# Patient Record
Sex: Male | Born: 1980 | Race: Black or African American | Hispanic: No | Marital: Married | Smoking: Never smoker
Health system: Southern US, Community
[De-identification: ages and names within clinical notes are randomized; demographics above are authoritative.]

## PROBLEM LIST (undated history)

## (undated) HISTORY — PX: SHOULDER SURGERY: SHX246

## (undated) HISTORY — PX: KNEE SURGERY: SHX244

---

## 2004-08-20 ENCOUNTER — Encounter: Admission: RE | Admit: 2004-08-20 | Discharge: 2004-08-20 | Payer: Self-pay | Admitting: Sports Medicine

## 2004-10-11 ENCOUNTER — Ambulatory Visit (HOSPITAL_BASED_OUTPATIENT_CLINIC_OR_DEPARTMENT_OTHER): Admission: RE | Admit: 2004-10-11 | Discharge: 2004-10-11 | Payer: Self-pay | Admitting: Orthopedic Surgery

## 2005-09-20 ENCOUNTER — Emergency Department: Payer: Self-pay | Admitting: Emergency Medicine

## 2005-09-21 ENCOUNTER — Emergency Department: Payer: Self-pay | Admitting: Emergency Medicine

## 2005-09-23 ENCOUNTER — Inpatient Hospital Stay: Payer: Self-pay | Admitting: Internal Medicine

## 2006-01-13 ENCOUNTER — Emergency Department: Payer: Self-pay | Admitting: Emergency Medicine

## 2006-01-21 ENCOUNTER — Inpatient Hospital Stay: Payer: Self-pay | Admitting: Internal Medicine

## 2006-07-06 ENCOUNTER — Emergency Department: Payer: Self-pay | Admitting: Internal Medicine

## 2006-09-04 ENCOUNTER — Emergency Department: Payer: Self-pay | Admitting: General Practice

## 2007-08-03 ENCOUNTER — Emergency Department: Payer: Self-pay | Admitting: Emergency Medicine

## 2008-04-26 ENCOUNTER — Emergency Department: Payer: Self-pay | Admitting: Emergency Medicine

## 2008-10-14 ENCOUNTER — Emergency Department: Payer: Self-pay | Admitting: Internal Medicine

## 2009-08-20 ENCOUNTER — Emergency Department: Payer: Self-pay | Admitting: Emergency Medicine

## 2009-08-30 ENCOUNTER — Ambulatory Visit: Payer: Self-pay | Admitting: Orthopedic Surgery

## 2009-09-01 ENCOUNTER — Inpatient Hospital Stay: Payer: Self-pay | Admitting: Vascular Surgery

## 2009-09-04 ENCOUNTER — Ambulatory Visit: Payer: Self-pay | Admitting: Orthopedic Surgery

## 2009-10-05 ENCOUNTER — Encounter: Payer: Self-pay | Admitting: Orthopedic Surgery

## 2009-11-04 ENCOUNTER — Encounter: Payer: Self-pay | Admitting: Orthopedic Surgery

## 2009-11-09 ENCOUNTER — Ambulatory Visit: Payer: Self-pay | Admitting: Orthopedic Surgery

## 2009-12-05 ENCOUNTER — Encounter: Payer: Self-pay | Admitting: Orthopedic Surgery

## 2010-01-02 ENCOUNTER — Encounter: Payer: Self-pay | Admitting: Orthopedic Surgery

## 2011-02-22 IMAGING — CR LEFT WRIST - COMPLETE 3+ VIEW
1 series · 4 of 4 positions shown · non-contrast
Comparison: None

REASON FOR EXAM: pain, injry
COMMENTS:

PROCEDURE:     DXR - DXR WRIST LT COMP WITH OBLIQUES  - August 20, 2009  [DATE]
RESULT:     History: Pain

[Series 1: view not recorded · 0.17mm/px · 4 of 4 slices shown]
[im 1/4]
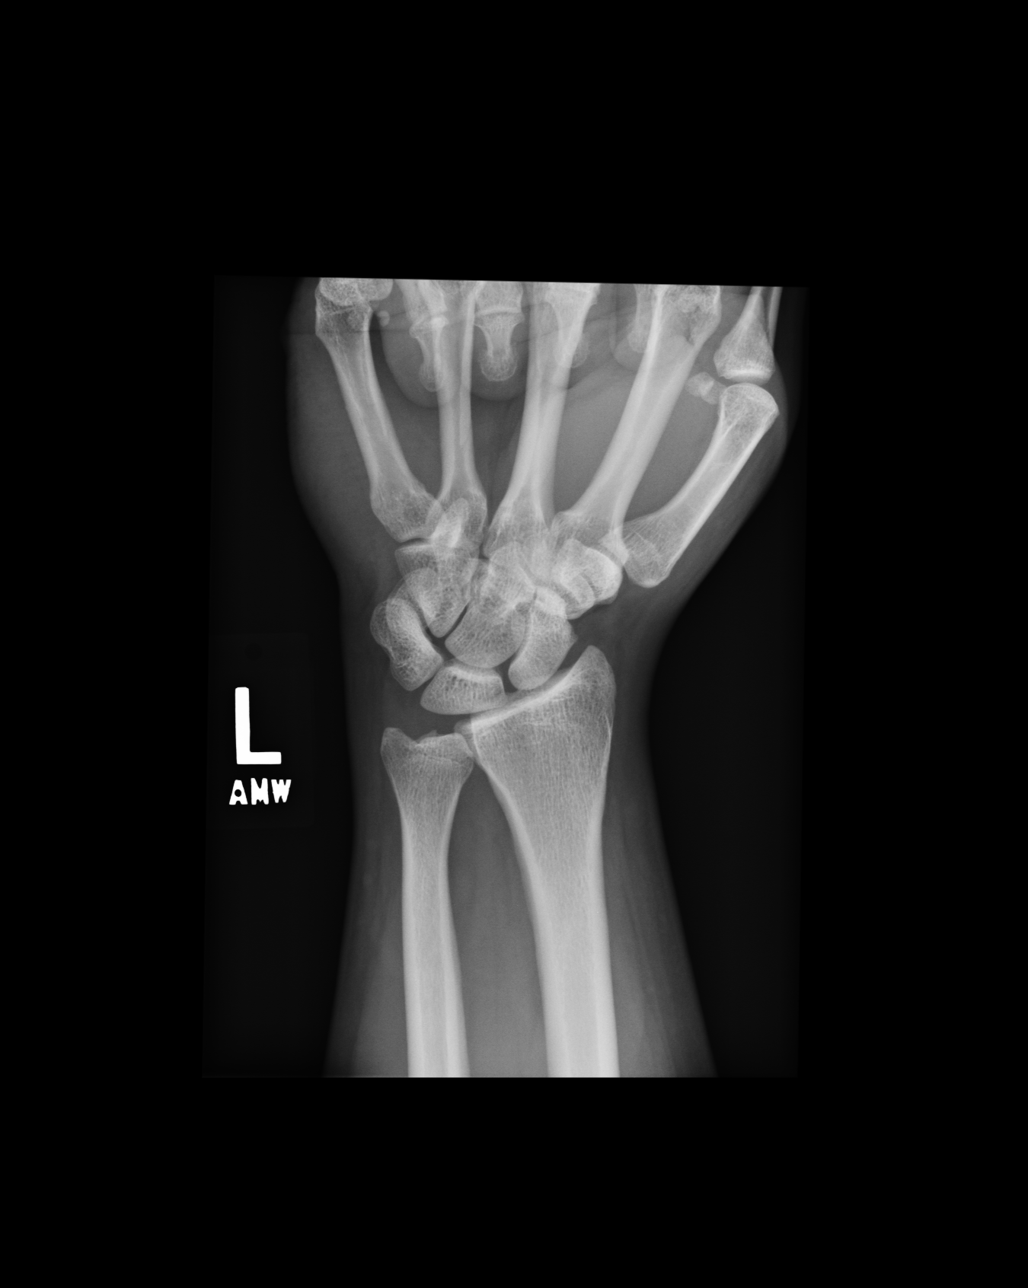
[im 2/4]
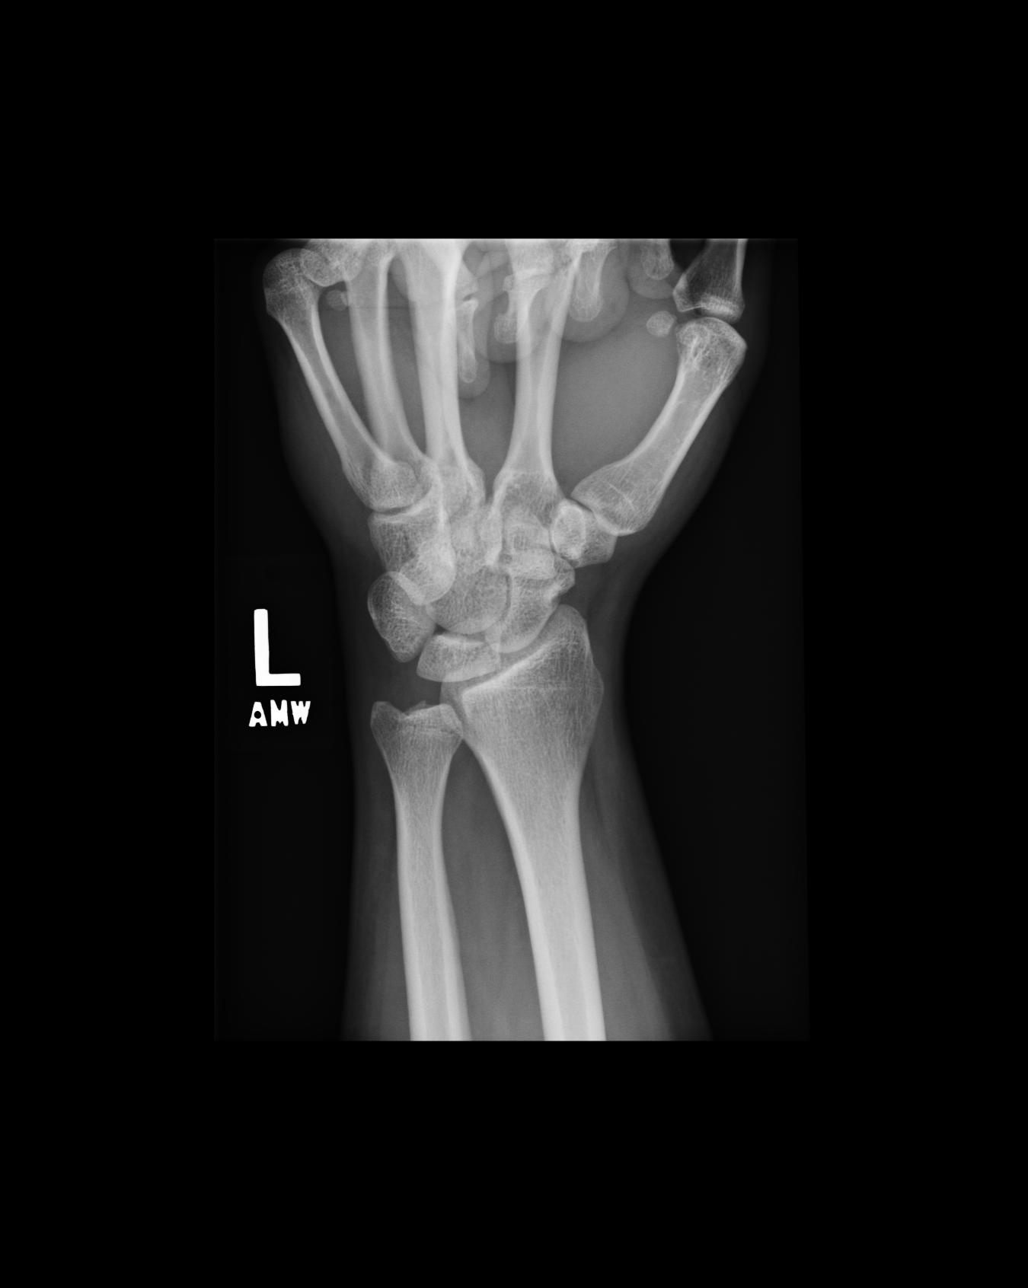
[im 3/4]
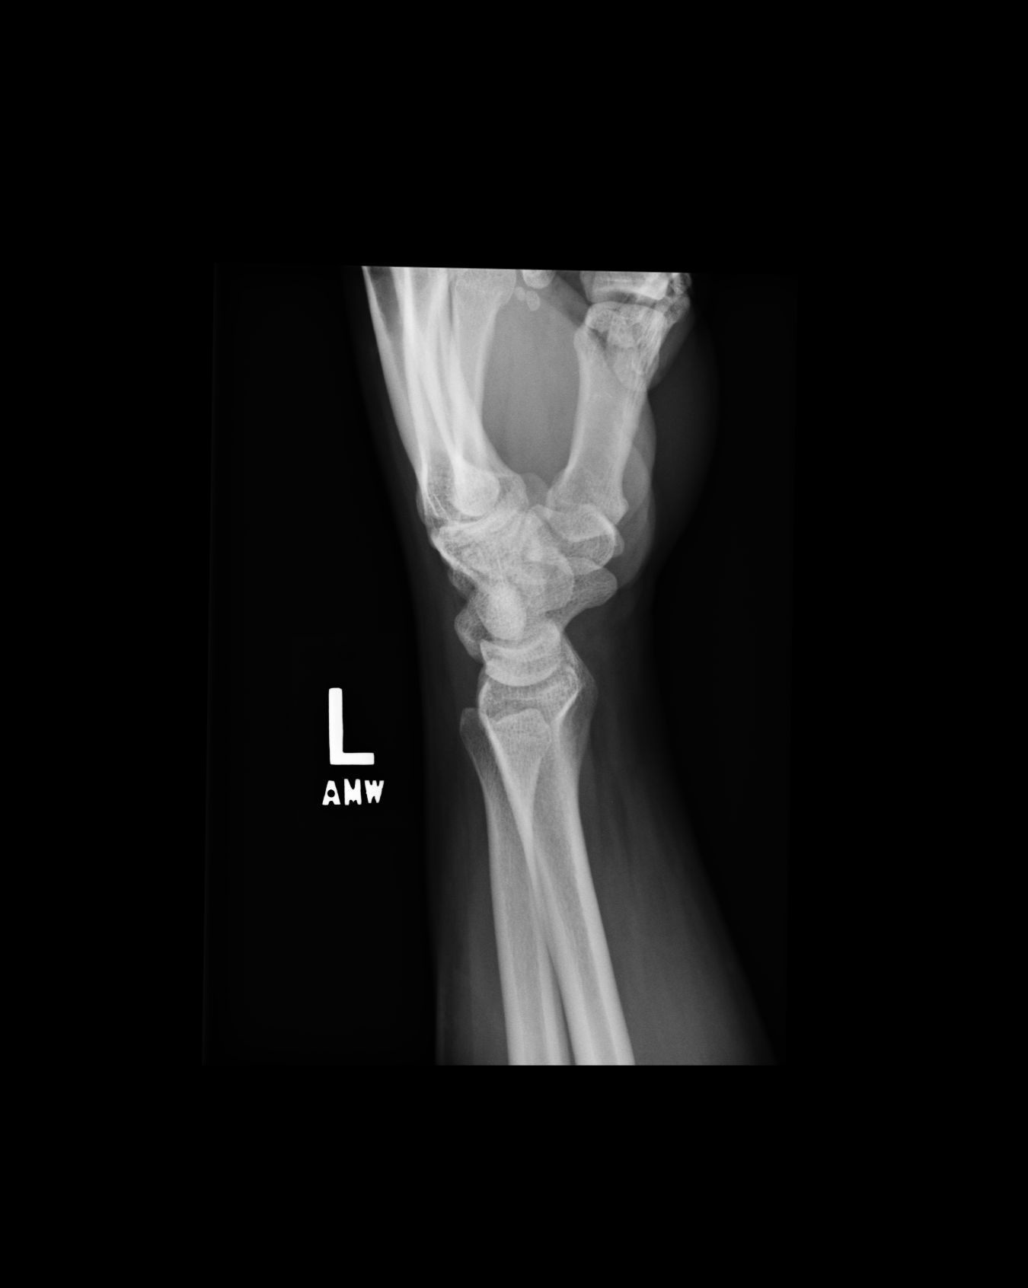
[im 4/4]
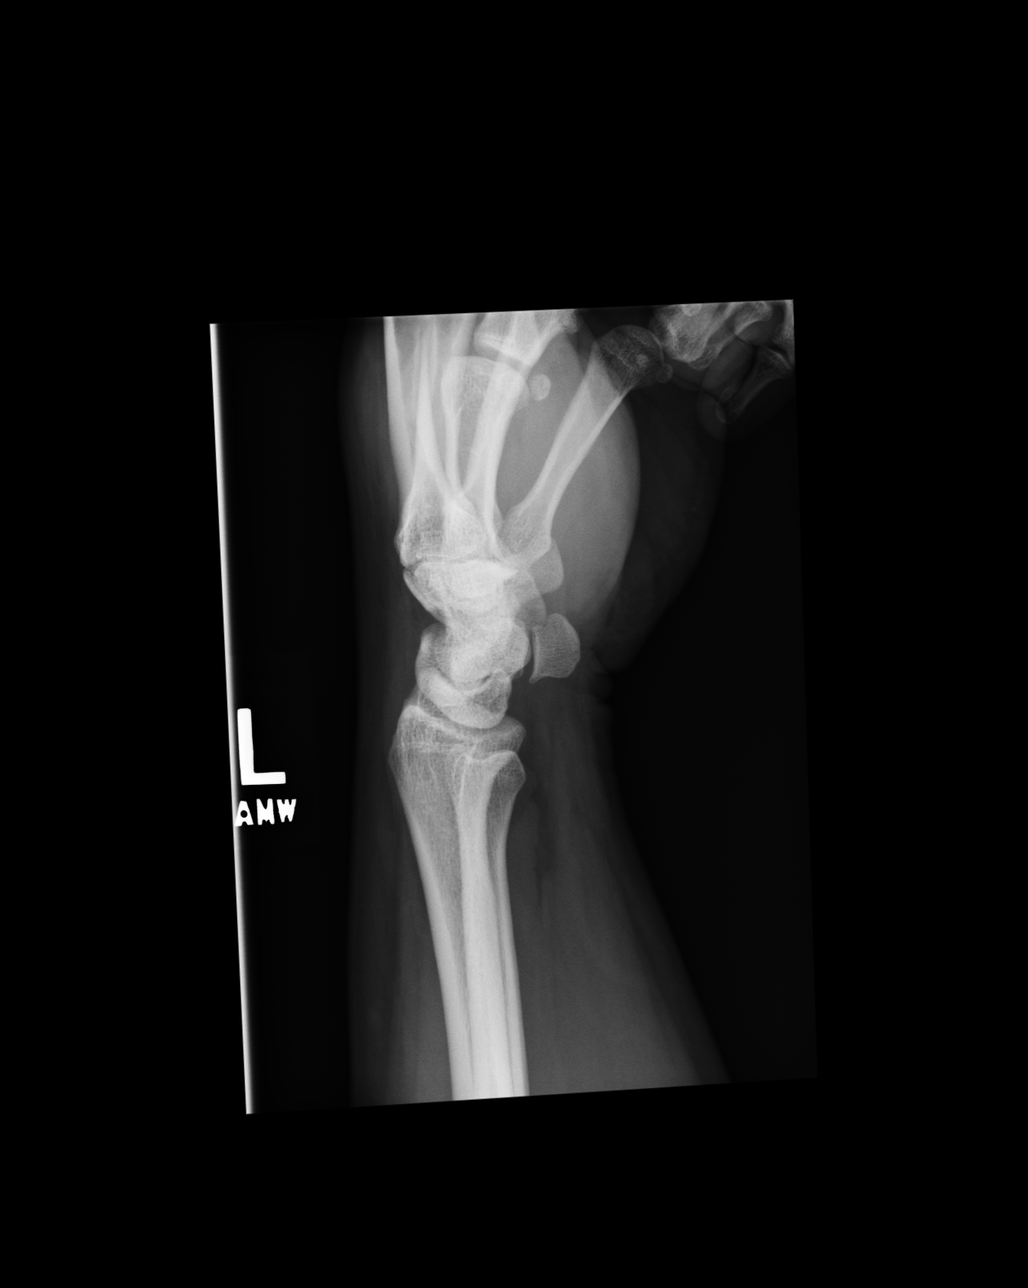

[4 of 4 positions shown; findings below may reference images not displayed]

FINDINGS: 4 views of the right wrist demonstrates no acute fracture or dislocation.
The joint spaces are maintained. The soft tissues appear normal.
IMPRESSION: No acute osseous abnormality of the right wrist.

If there is clinical concern regarding a radiographically occult scaphoid
fracture, snuff box tenderness, or ligamentous injury, further assessment
with MRI is recommended.

## 2011-02-22 IMAGING — CR DG KNEE COMPLETE 4+V*R*
1 series · 4 of 4 positions shown · non-contrast
Comparison: none

REASON FOR EXAM: PAINFUL
COMMENTS:

PROCEDURE:     DXR - DXR KNEE RT COMP WITH OBLIQUES  - August 20, 2009 [DATE]
RESULT:     Comparison:  None

[Series 1: view not recorded · 0.17mm/px · 4 of 4 slices shown]
[im 1/4]
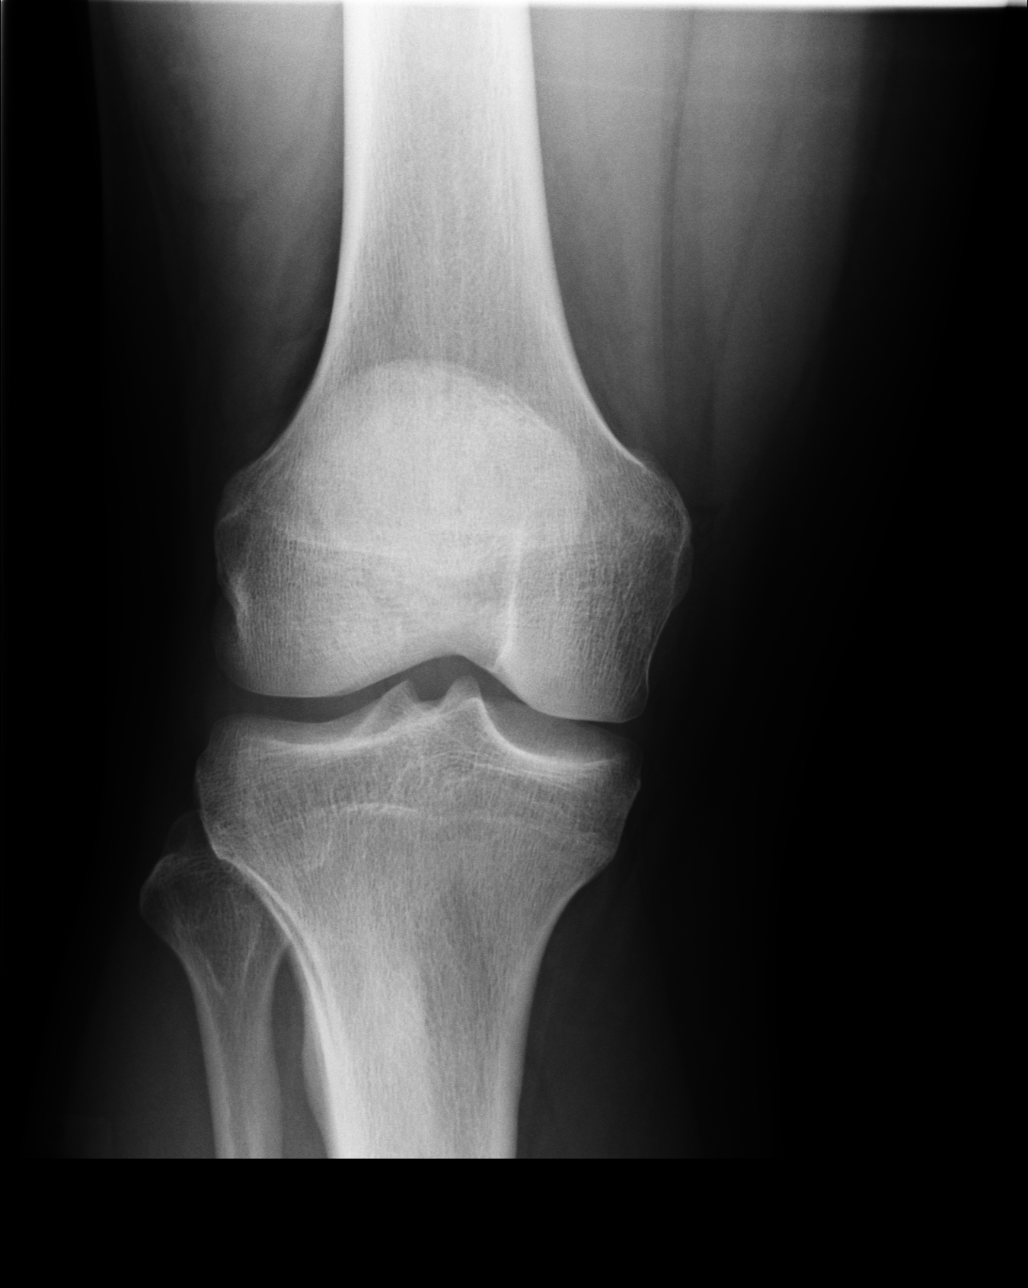
[im 2/4]
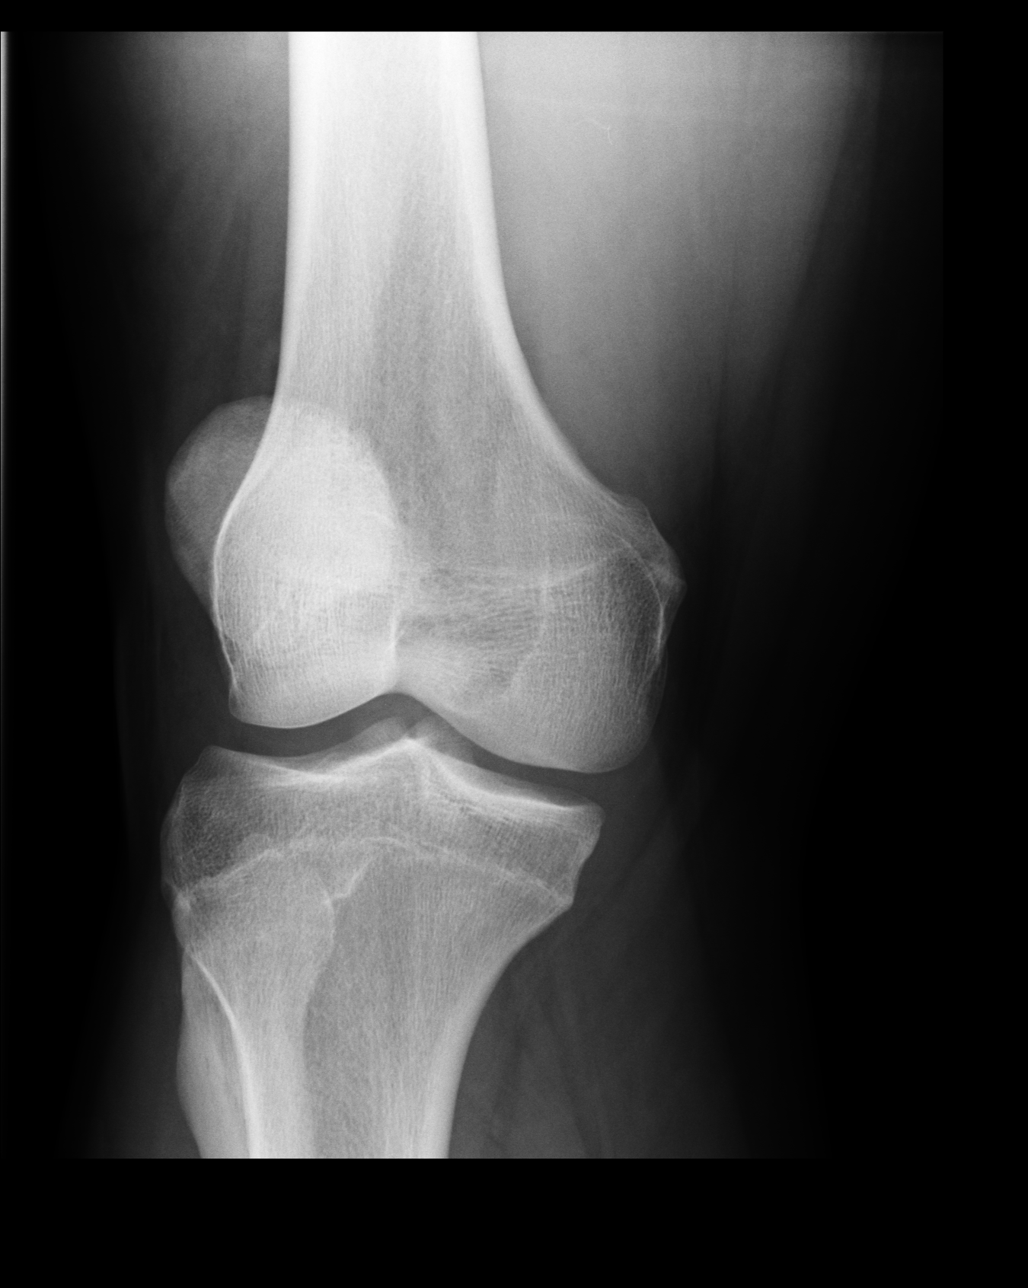
[im 3/4]
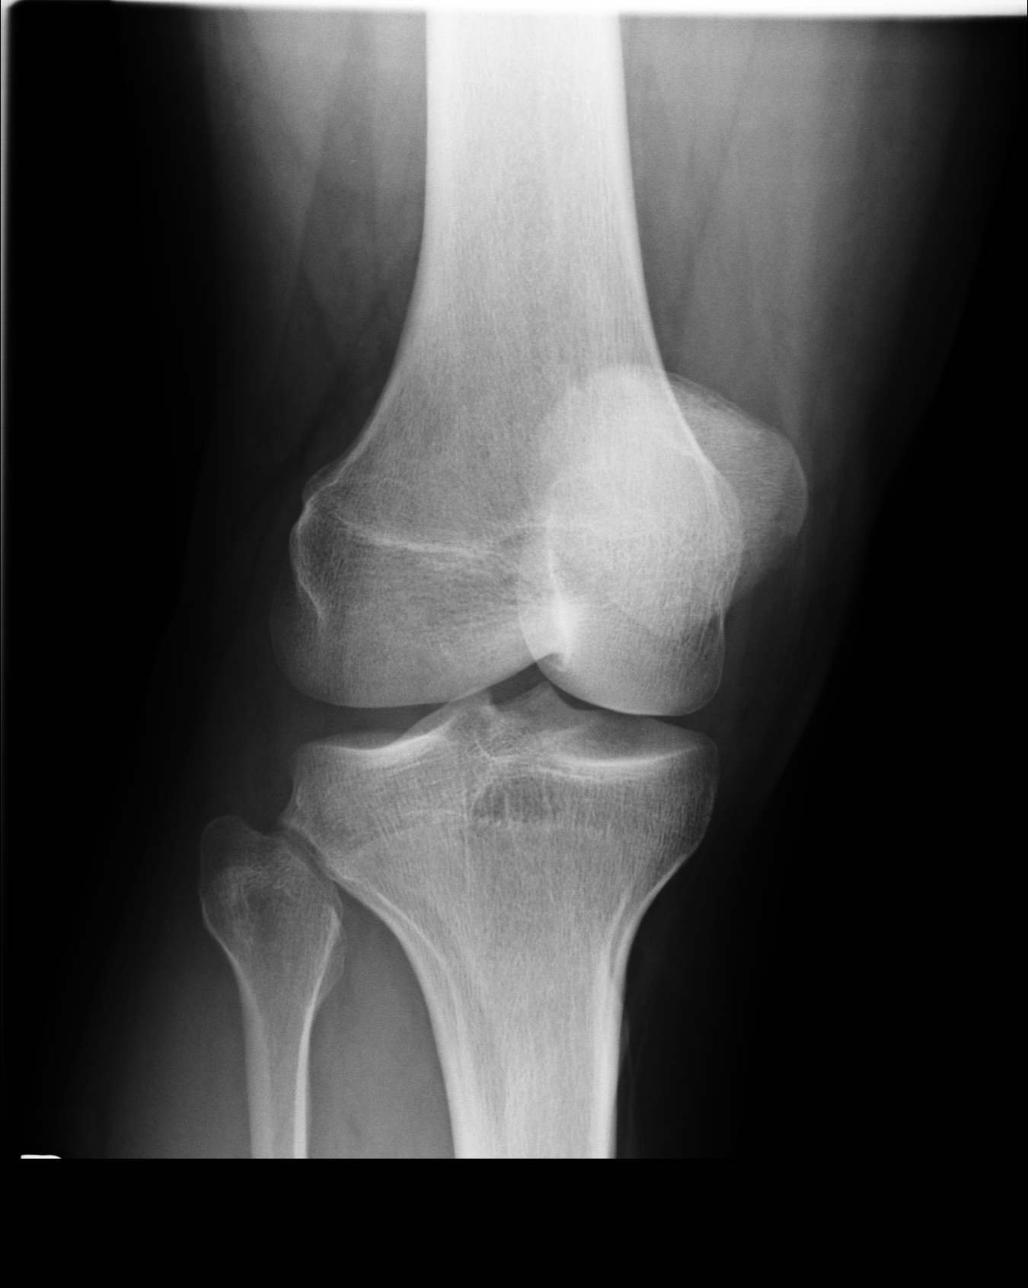
[im 4/4]
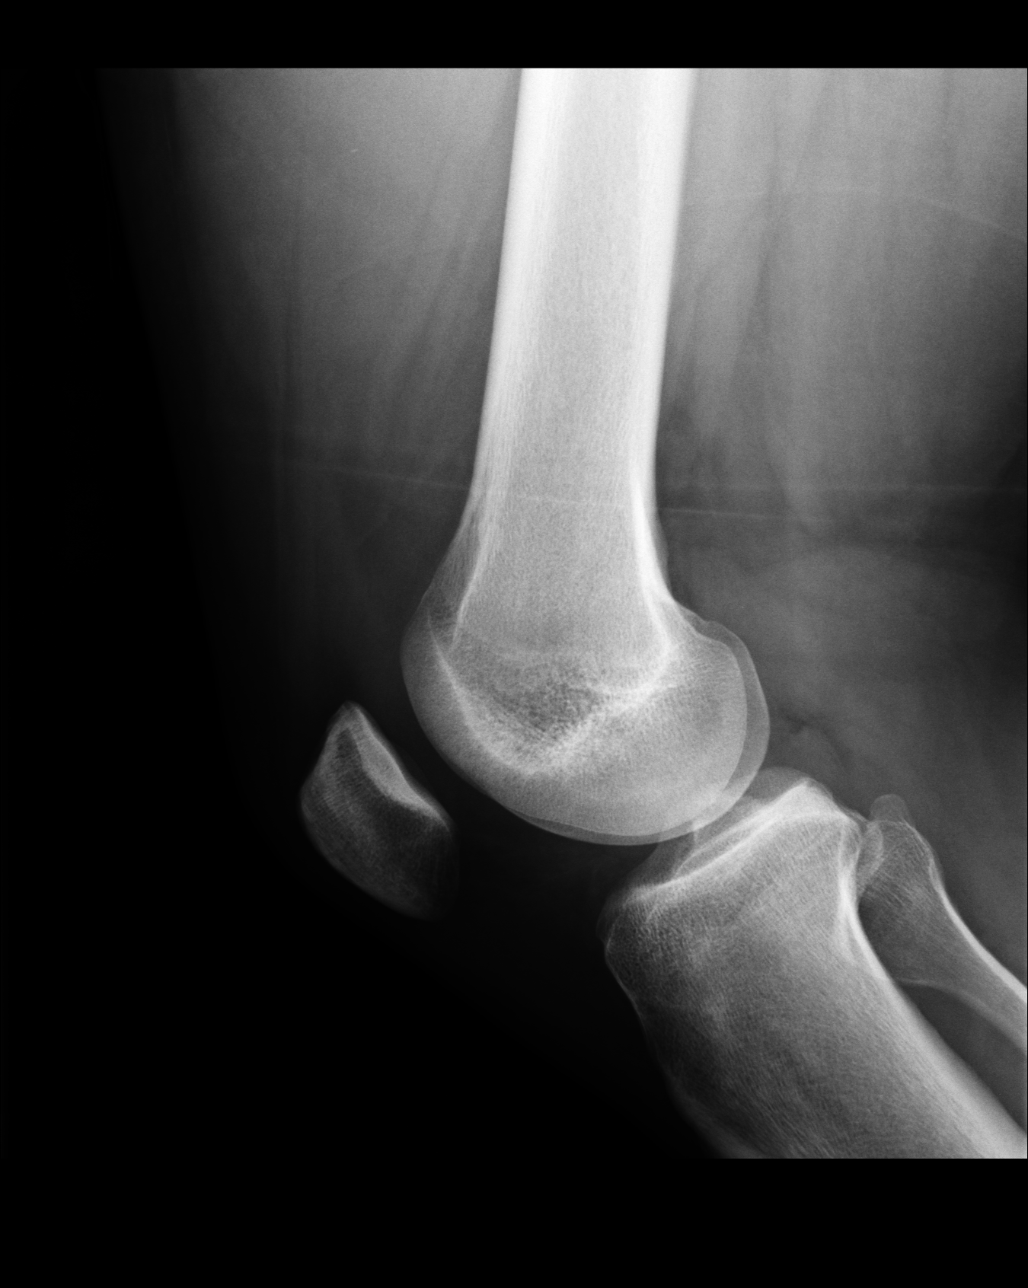

[4 of 4 positions shown; findings below may reference images not displayed]

FINDINGS: 4 views of the right knee demonstrates no acute fracture or dislocation.
There is no significant joint effusion.
IMPRESSION: No acute osseous injury of the right knee.

## 2015-10-10 ENCOUNTER — Encounter: Payer: Self-pay | Admitting: Emergency Medicine

## 2015-10-10 ENCOUNTER — Emergency Department: Payer: BC Managed Care – PPO

## 2015-10-10 ENCOUNTER — Emergency Department
Admission: EM | Admit: 2015-10-10 | Discharge: 2015-10-10 | Disposition: A | Discharge status: Home / Self Care | Payer: BC Managed Care – PPO | Attending: Emergency Medicine | Admitting: Emergency Medicine

## 2015-10-10 DIAGNOSIS — M25512 Pain in left shoulder: Secondary | ICD-10-CM | POA: Diagnosis present

## 2015-10-10 DIAGNOSIS — M19019 Primary osteoarthritis, unspecified shoulder: Secondary | ICD-10-CM

## 2015-10-10 DIAGNOSIS — M19012 Primary osteoarthritis, left shoulder: Secondary | ICD-10-CM | POA: Insufficient documentation

## 2015-10-10 MED ORDER — KETOROLAC TROMETHAMINE 60 MG/2ML IM SOLN
60.0000 mg | Freq: Once | INTRAMUSCULAR | Status: AC
  Administered 2015-10-10: 60 mg via INTRAMUSCULAR
  Filled 2015-10-10: qty 2

## 2015-10-10 MED ORDER — MELOXICAM 15 MG PO TABS: 15.0000 mg | ORAL_TABLET | Freq: Every day | ORAL | Status: DC

## 2015-10-10 NOTE — ED Notes (Signed)
Pt c/o left shoulder pain since he woke Sunday morning; worked out yesterday and pain is worse today; says pain at rest, worse with movement; pt took hydrocodone at 0630 and ibuprofen at 9 with some relief;

## 2015-10-10 NOTE — ED Notes (Signed)
Pt in w/ complaints of left shoulder pain since Sunday; reports waking up w/ this new pain.  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

## 2015-10-10 NOTE — ED Provider Notes (Signed)
[facility] Emergency Department Provider Note  ____________________________________________  Time seen: Approximately 11:08 AM  I have reviewed the triage vital signs and the nursing notes.   HISTORY  Chief Complaint Shoulder Pain   HPI Billy Weaver is a 34 y.o. male is here complaining of left shoulder pain 3 days. Patient states that this morning he woke up and the pain was worse. He took a hydrocodone at 6:30 AM today along with a ibuprofen at 9 AM with some relief. He denies any recent injury. 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 He denies any chest pain or shortness of breath. There is been no nausea vomiting. He denies any paresthesias in his left arm.Currently his pain is 5 out of 10.   History reviewed. No pertinent past medical history.  There are no active problems to display for this patient.   Past Surgical History  Procedure Laterality Date  . Shoulder surgery Right   . Knee surgery Right     Current Outpatient Rx  Name  Route  Sig  Dispense  Refill  . meloxicam (MOBIC) 15 MG tablet   Oral   Take 1 tablet (15 mg total) by mouth daily.   30 tablet   2     Allergies Review of patient's allergies indicates no known allergies.  History reviewed. No pertinent family history.  Social History Social History  Substance Use Topics  . Smoking status: Never Smoker   . Smokeless tobacco: None  . Alcohol Use: Yes    Review of Systems Constitutional: No fever/chills ENT: No sore throat. Cardiovascular: Denies chest pain. Respiratory: Denies shortness of breath. Gastrointestinal:   No nausea, no vomiting.  Musculoskeletal: Negative for back pain. Positive left shoulder pain. Skin: Negative for rash. Neurological: Negative for headaches, focal weakness or numbness.  10-point ROS otherwise negative.  ____________________________________________   PHYSICAL  EXAM:  VITAL SIGNS: ED Triage Vitals  Enc Vitals Group     BP 10/10/15 1051 119/82 mmHg     Pulse Rate 10/10/15 1051 66     Resp 10/10/15 1051 20     Temp 10/10/15 1051 98.2 F (36.8 C)     Temp Source 10/10/15 1051 Oral     SpO2 10/10/15 1051 99 %     Weight 10/10/15 1051 265 lb (120.203 kg)     Height 10/10/15 1051 6' 1.5" (1.867 m)     Head Cir --      Peak Flow --      Pain Score 10/10/15 1053 5     Pain Loc --      Pain Edu? --      Excl. in GC? --     Constitutional: Alert and oriented. Well appearing and in no acute distress. Eyes: Conjunctivae are normal. PERRL. EOMI. Head: Atraumatic. Nose: No congestion/rhinnorhea. Neck: No stridor.   Cardiovascular: Normal rate, regular rhythm. Grossly normal heart sounds.  Good peripheral circulation. Respiratory: Normal respiratory effort.  No retractions. Lungs CTAB. Gastrointestinal: Soft and nontender. No distention. Musculoskeletal: Left shoulder no gross deformity was seen. No joint effusion. There is moderate tenderness on palpation of the [facility] joint, anterior aspect and deltoid area. Range of motion is within normal limits in all planes with no crepitus noted. Motor sensory function intact. Muscle strength within normal limits. Neurologic:  Normal speech and language. No gross focal neurologic deficits are appreciated. No gait instability. Skin:  Skin is warm, dry and intact.  No rash noted. Psychiatric: Mood and affect are normal. Speech and behavior are normal.  ____________________________________________   LABS (all labs ordered are listed, but only abnormal results are displayed)  Labs Reviewed - No data to display  RADIOLOGY  Left shoulder x-ray per radiologist shows rotator cuff calcific tendinitis along with diffuse spurring from the acromioclavicular joint. I, Tommi Rumpshonda L Brittany Amirault, personally viewed and evaluated these images (plain radiographs) as part of my medical decision making.   ____________________________________________   PROCEDURES  Procedure(s) performed: None  Critical Care performed: No  ____________________________________________   INITIAL IMPRESSION / ASSESSMENT AND PLAN / ED COURSE  Pertinent labs & imaging results that were available during my care of the patient were reviewed by me and considered in my medical decision making (see chart for details).  Patient was given Toradol 60 mg IM while in the emergency room and a prescription for meloxicam 15 milligrams to be taken once a day with food. He is also to follow-up with Dr. Joice LoftsPoggi if any continued problems with his shoulder. ____________________________________________   FINAL CLINICAL IMPRESSION(S) / ED DIAGNOSES  Final diagnoses:  Shoulder pain, acute, left  Degenerative joint disease of acromioclavicular joint      Tommi Rumpshonda L Phebe Dettmer, PA-C 10/10/15 1247  Emily FilbertJonathan E Williams, MD 10/10/15 [phone]

## 2015-10-10 NOTE — Discharge Instructions (Signed)
Cryotherapy  Cryotherapy is when you put ice on your injury. Ice helps lessen pain and puffiness (swelling) after an injury. Ice works the best when you start using it in the first 24 to 48 hours after an injury.  HOME CARE  · Put a dry or damp towel between the ice pack and your skin.  · You may press gently on the ice pack.  · Leave the ice on for no more than 10 to 20 minutes at a time.  · Check your skin after 5 minutes to make sure your skin is okay.  · Rest at least 20 minutes between ice pack uses.  · Stop using ice when your skin loses feeling (numbness).  · Do not use ice on someone who cannot tell you when it hurts. This includes small children and people with memory problems (dementia).  GET HELP RIGHT AWAY IF:  · You have white spots on your skin.  · Your skin turns blue or pale.  · Your skin feels waxy or hard.  · Your puffiness gets worse.  MAKE SURE YOU:   · Understand these instructions.  · Will watch your condition.  · Will get help right away if you are not doing well or get worse.     This information is not intended to replace advice given to you by your health care provider. Make sure you discuss any questions you have with your health care provider.     Document Released: 04/08/2008 Document Revised: 01/13/2012 Document Reviewed: 06/13/2011  Elsevier Interactive Patient Education ©2016 [employer]

## 2017-07-08 ENCOUNTER — Emergency Department: Payer: BC Managed Care – PPO

## 2017-07-08 ENCOUNTER — Emergency Department
Admission: EM | Admit: 2017-07-08 | Discharge: 2017-07-08 | Disposition: A | Discharge status: Home / Self Care | Payer: BC Managed Care – PPO | Attending: Emergency Medicine | Admitting: Emergency Medicine

## 2017-07-08 DIAGNOSIS — R079 Chest pain, unspecified: Secondary | ICD-10-CM | POA: Diagnosis not present

## 2017-07-08 LAB — CBC WITH DIFFERENTIAL/PLATELET
Basophils Absolute: 0 10*3/uL (ref 0–0.1)
Basophils Relative: 1 %
Eosinophils Absolute: 0.3 10*3/uL (ref 0–0.7)
Eosinophils Relative: 5 %
HCT: 41.2 % (ref 40.0–52.0)
Hemoglobin: 14.3 g/dL (ref 13.0–18.0)
Lymphocytes Relative: 37 %
Lymphs Abs: 2.5 10*3/uL (ref 1.0–3.6)
MCH: 30.6 pg (ref 26.0–34.0)
MCHC: 34.7 g/dL (ref 32.0–36.0)
MCV: 88.4 fL (ref 80.0–100.0)
Monocytes Absolute: 0.6 10*3/uL (ref 0.2–1.0)
Monocytes Relative: 10 %
Neutro Abs: 3.2 10*3/uL (ref 1.4–6.5)
Neutrophils Relative %: 47 %
Platelets: 314 10*3/uL (ref 150–440)
RBC: 4.66 MIL/uL (ref 4.40–5.90)
RDW: 13 % (ref 11.5–14.5)
WBC: 6.7 10*3/uL (ref 3.8–10.6)

## 2017-07-08 LAB — BASIC METABOLIC PANEL
Anion gap: 8 (ref 5–15)
BUN: 9 mg/dL (ref 6–20)
CO2: 27 mmol/L (ref 22–32)
Calcium: 9.2 mg/dL (ref 8.9–10.3)
Chloride: 103 mmol/L (ref 101–111)
Creatinine, Ser: 1.3 mg/dL — ABNORMAL HIGH (ref 0.61–1.24)
GFR calc Af Amer: >60 mL/min (ref >60)
GFR calc non Af Amer: >60 mL/min (ref >60)
Glucose, Bld: 101 mg/dL — ABNORMAL HIGH (ref 65–99)
Potassium: 3.9 mmol/L (ref 3.5–5.1)
Sodium: 138 mmol/L (ref 135–145)

## 2017-07-08 LAB — TROPONIN I
Troponin I: <0.03 ng/mL (ref <0.03)
Troponin I: <0.03 ng/mL (ref <0.03)

## 2017-07-08 LAB — CK: Total CK: 323 U/L (ref 49–397)

## 2017-07-08 MED ORDER — CYCLOBENZAPRINE HCL 10 MG PO TABS: 10.0000 mg | ORAL_TABLET | Freq: Three times a day (TID) | ORAL | 0 refills | Status: DC | PRN

## 2017-07-08 MED ORDER — SODIUM CHLORIDE 0.9 % IV BOLUS (SEPSIS)
1000.0000 mL | Freq: Once | INTRAVENOUS | Status: AC
Start: 2017-07-08 — End: 2017-07-08
  Administered 2017-07-08: 1000 mL via INTRAVENOUS

## 2017-07-08 MED ORDER — ASPIRIN 81 MG PO CHEW
324.0000 mg | CHEWABLE_TABLET | Freq: Once | ORAL | Status: AC
  Administered 2017-07-08: 324 mg via ORAL
  Filled 2017-07-08: qty 4

## 2017-07-08 NOTE — ED Provider Notes (Signed)
[facility] Emergency Department Provider Note  ____________________________________________  Time seen: Approximately 8:15 AM  I have reviewed the triage vital signs and the nursing notes.   HISTORY  Chief Complaint Chest Pain   HPI Billy Weaver is a 36 y.o. male no significant past medical history who presents for evaluation of chest pain. Patient reports 3 days of intermittent sharp/cramping left-sided chest pain worse with movementand currently 0/10. Patient denies SOB, diaphoresis, N/V, abdominal pain, lightheadedness. He does have family history of heart attack on his father's side. No personal or family history of blood clots, no recent travel immobilization, no leg pain or swelling, no hemoptysis, no exogenous hormones. Patient reports that the pain first started Saturday evening. He reports that that afternoon he was moving a washer and dryer when his partner dropped one of the units and he reached to hold it. The pain started that evening. No cough, congestion or fever. NO paresthesias of his extremities. He reports being a social smoker.   PMH  Ingrown left big toenail    Ingrown right big toenail       There are no active problems to display for this patient.   Past Surgical History:  Procedure Laterality Date  . KNEE SURGERY Right   . SHOULDER SURGERY Right     Prior to Admission medications   Medication Sig Start Date End Date Taking? Authorizing Provider  cyclobenzaprine (FLEXERIL) 10 MG tablet Take 1 tablet (10 mg total) by mouth 3 (three) times daily as needed for muscle spasms. 07/08/17   Nita Sickle, MD    Allergies Patient has no known allergies.   FH Arthritis Other Positive Hx   Asthma Other Positive Hx   Heart disease Other Positive Hx   High blood pressure (Hypertension) Other Positive Hx   Stroke Other Positive Hx      Social History Social History  Substance Use Topics  . Smoking status: Never  Smoker  . Smokeless tobacco: Never Used  . Alcohol use Yes    Review of Systems  Constitutional: Negative for fever. Eyes: Negative for visual changes. ENT: Negative for sore throat. Neck: No neck pain  Cardiovascular: + chest pain. Respiratory: Negative for shortness of breath. Gastrointestinal: Negative for abdominal pain, vomiting or diarrhea. Genitourinary: Negative for dysuria. Musculoskeletal: Negative for back pain. Skin: Negative for rash. Neurological: Negative for headaches, weakness or numbness. Psych: No SI or HI  ____________________________________________   PHYSICAL EXAM:  VITAL SIGNS: Vitals:   07/08/17 1115 07/08/17 1200  BP: (!) 141/96 122/84  Pulse: (!) 53 (!) 59  Resp: 10 11  SpO2: 99% 98%    Constitutional: Alert and oriented. Well appearing and in no apparent distress. HEENT:      Head: Normocephalic and atraumatic.         Eyes: Conjunctivae are normal. Sclera is non-icteric.       Mouth/Throat: Mucous membranes are moist.       Neck: Supple with no signs of meningismus. Cardiovascular: Regular rate and rhythm. No murmurs, gallops, or rubs. 2+ symmetrical distal pulses are present in all extremities. No JVD. Respiratory: Normal respiratory effort. Lungs are clear to auscultation bilaterally. No wheezes, crackles, or rhonchi.  Gastrointestinal: Soft, non tender, and non distended with positive bowel sounds. No rebound or guarding. Genitourinary: No CVA tenderness. Musculoskeletal: Nontender with normal range of motion in all extremities. No edema, cyanosis, or erythema of extremities. Neurologic: Normal speech and language. Face is symmetric. Moving all extremities. No gross  focal neurologic deficits are appreciated. Skin: Skin is warm, dry and intact. No rash noted. Psychiatric: Mood and affect are normal. Speech and behavior are normal.  ____________________________________________   LABS (all labs ordered are listed, but only abnormal  results are displayed)  Labs Reviewed  BASIC METABOLIC PANEL - Abnormal; Notable for the following:       Result Value   Glucose, Bld 101 (*)    Creatinine, Ser 1.30 (*)    All other components within normal limits  CBC WITH DIFFERENTIAL/PLATELET  TROPONIN I  TROPONIN I  CK   ____________________________________________  EKG  ED ECG REPORT I, Nita Sicklearolina Clary Meeker, the attending physician, personally viewed and interpreted this ECG.  Normal sinus rhythm, rate of 61, first-degree AV block, normal QRS and QTc intervals, normal axis, no ST elevations or depressions.  ____________________________________________  RADIOLOGY  CXR: negative  ____________________________________________   PROCEDURES  Procedure(s) performed: None Procedures Critical Care performed:  None ____________________________________________   INITIAL IMPRESSION / ASSESSMENT AND PLAN / ED COURSE  36 y.o. male no significant past medical history who presents for evaluation of chest pain intermittent, sharp x 3 days since carrying washer and dryer. EKG with no ischemic changes. Presentation concerning for muscle spasms however with fh of ischemic heart disease and smoking and Heart score of 1 will get two sets of troponin and give ASA. Will monitor on telemetry. PERC negative. Strong symmetric bilateral pulses, no neuro deficits, no pain at this time, no hypertension or tachycardia, no widened mediastinum therefore dissection felt to be unlikely. No PTX or PNA on CXR.    ----------------------------------------- 8:13 PM on 07/08/2017 -----------------------------------------   OBSERVATION CARE: This patient is being placed under observation care for the following reasons: Chest pain with repeat testing to rule out ischemia   ----------------------------------------- 10:13 PM on 07/08/2017 ----------------------------------------- patient remains pain free. Initial troponin is negative. Second troponin is  due at 11:15. Creatinine is elevated at 1.30 with no prior for comparison, normal GFR.  ----------------------------------------- 12:15 PM on 07/08/2017 -----------------------------------------   END OF OBSERVATION STATUS: After an appropriate period of observation, this patient is being discharged due to the following reason(s):  Troponin 2 negative. Patient remains pain free. [facility] evaluation of elevated creatinine and chest pain. Patient be prescribed muscle relaxants for possible muscular/chest wall pain. Discussed strict return precautions for any new or worsening chest pain, diaphoresis, nausea, vomiting, shortness of breath, lightheadedness.    Pertinent labs & imaging results that were available during my care of the patient were reviewed by me and considered in my medical decision making (see chart for details).    ____________________________________________   FINAL CLINICAL IMPRESSION(S) / ED DIAGNOSES  Final diagnoses:  Chest pain, unspecified type      NEW MEDICATIONS STARTED DURING THIS VISIT:  New Prescriptions   CYCLOBENZAPRINE (FLEXERIL) 10 MG TABLET    Take 1 tablet (10 mg total) by mouth 3 (three) times daily as needed for muscle spasms.     Note:  This document was prepared using Dragon voice recognition software and may include unintentional dictation errors.    Don PerkingVeronese, [place], MD 07/08/17 [phone]

## 2017-07-08 NOTE — ED Notes (Signed)
Pt verbalizes understanding of d/c teaching and rx. Pt with family at time of d/c, NAD noted, VS stable, pt ambulatory

## 2017-07-08 NOTE — ED Notes (Signed)
Patient transported to X-ray 

## 2017-07-08 NOTE — Discharge Instructions (Signed)

## 2017-07-08 NOTE — ED Triage Notes (Signed)
Pt c/o left sided chest pain worse with movement and  Deep breathing since Saturday, states he was carrying some furniture and it slipped and he had to grab it. But is concerned about his heart due to family history.

## 2021-12-30 ENCOUNTER — Other Ambulatory Visit: Payer: Self-pay

## 2021-12-30 ENCOUNTER — Emergency Department
Admission: EM | Admit: 2021-12-30 | Discharge: 2021-12-30 | Disposition: A | Discharge status: Home / Self Care | Payer: No Typology Code available for payment source | Attending: Emergency Medicine | Admitting: Emergency Medicine

## 2021-12-30 ENCOUNTER — Emergency Department: Payer: No Typology Code available for payment source

## 2021-12-30 DIAGNOSIS — T1490XA Injury, unspecified, initial encounter: Secondary | ICD-10-CM

## 2021-12-30 DIAGNOSIS — Z79899 Other long term (current) drug therapy: Secondary | ICD-10-CM | POA: Diagnosis not present

## 2021-12-30 DIAGNOSIS — Y908 Blood alcohol level of 240 mg/100 ml or more: Secondary | ICD-10-CM | POA: Diagnosis not present

## 2021-12-30 DIAGNOSIS — S0181XA Laceration without foreign body of other part of head, initial encounter: Secondary | ICD-10-CM | POA: Diagnosis not present

## 2021-12-30 DIAGNOSIS — S40811A Abrasion of right upper arm, initial encounter: Secondary | ICD-10-CM | POA: Insufficient documentation

## 2021-12-30 DIAGNOSIS — S80811A Abrasion, right lower leg, initial encounter: Secondary | ICD-10-CM | POA: Insufficient documentation

## 2021-12-30 DIAGNOSIS — S0081XA Abrasion of other part of head, initial encounter: Secondary | ICD-10-CM | POA: Diagnosis not present

## 2021-12-30 DIAGNOSIS — Z23 Encounter for immunization: Secondary | ICD-10-CM | POA: Insufficient documentation

## 2021-12-30 DIAGNOSIS — S40812A Abrasion of left upper arm, initial encounter: Secondary | ICD-10-CM | POA: Diagnosis not present

## 2021-12-30 DIAGNOSIS — R7989 Other specified abnormal findings of blood chemistry: Secondary | ICD-10-CM | POA: Diagnosis not present

## 2021-12-30 DIAGNOSIS — F10129 Alcohol abuse with intoxication, unspecified: Secondary | ICD-10-CM | POA: Diagnosis not present

## 2021-12-30 DIAGNOSIS — S0993XA Unspecified injury of face, initial encounter: Secondary | ICD-10-CM | POA: Diagnosis present

## 2021-12-30 DIAGNOSIS — F10920 Alcohol use, unspecified with intoxication, uncomplicated: Secondary | ICD-10-CM

## 2021-12-30 DIAGNOSIS — T07XXXA Unspecified multiple injuries, initial encounter: Secondary | ICD-10-CM

## 2021-12-30 DIAGNOSIS — S80812A Abrasion, left lower leg, initial encounter: Secondary | ICD-10-CM | POA: Insufficient documentation

## 2021-12-30 DIAGNOSIS — S0001XA Abrasion of scalp, initial encounter: Secondary | ICD-10-CM | POA: Diagnosis not present

## 2021-12-30 DIAGNOSIS — Y92410 Unspecified street and highway as the place of occurrence of the external cause: Secondary | ICD-10-CM | POA: Insufficient documentation

## 2021-12-30 LAB — COMPREHENSIVE METABOLIC PANEL
ALT: 26 U/L (ref 0–44)
AST: 35 U/L (ref 15–41)
Albumin: 4.3 g/dL (ref 3.5–5.0)
Alkaline Phosphatase: 30 U/L — ABNORMAL LOW (ref 38–126)
Anion gap: 9 (ref 5–15)
BUN: 9 mg/dL (ref 6–20)
CO2: 27 mmol/L (ref 22–32)
Calcium: 8.4 mg/dL — ABNORMAL LOW (ref 8.9–10.3)
Chloride: 104 mmol/L (ref 98–111)
Creatinine, Ser: 1.41 mg/dL — ABNORMAL HIGH (ref 0.61–1.24)
GFR, Estimated: >60 mL/min (ref >60)
Glucose, Bld: 106 mg/dL — ABNORMAL HIGH (ref 70–99)
Potassium: 3.9 mmol/L (ref 3.5–5.1)
Sodium: 140 mmol/L (ref 135–145)
Total Bilirubin: 0.6 mg/dL (ref 0.3–1.2)
Total Protein: 7.8 g/dL (ref 6.5–8.1)

## 2021-12-30 LAB — CBC
HCT: 46.3 % (ref 39.0–52.0)
Hemoglobin: 15.2 g/dL (ref 13.0–17.0)
MCH: 30 pg (ref 26.0–34.0)
MCHC: 32.8 g/dL (ref 30.0–36.0)
MCV: 91.5 fL (ref 80.0–100.0)
Platelets: 360 10*3/uL (ref 150–400)
RBC: 5.06 MIL/uL (ref 4.22–5.81)
RDW: 13.3 % (ref 11.5–15.5)
WBC: 8.2 10*3/uL (ref 4.0–10.5)
nRBC: 0 % (ref 0.0–0.2)

## 2021-12-30 LAB — PROTIME-INR
INR: 0.9 (ref 0.8–1.2)
Prothrombin Time: 12.5 seconds (ref 11.4–15.2)

## 2021-12-30 LAB — ETHANOL: Alcohol, Ethyl (B): 243 mg/dL — ABNORMAL HIGH (ref <10)

## 2021-12-30 MED ORDER — SODIUM CHLORIDE 0.9 % IV BOLUS (SEPSIS)
1000.0000 mL | Freq: Once | INTRAVENOUS | Status: AC
  Administered 2021-12-30: 1000 mL via INTRAVENOUS

## 2021-12-30 MED ORDER — TETANUS-DIPHTH-ACELL PERTUSSIS 5-2.5-18.5 LF-MCG/0.5 IM SUSY
0.5000 mL | PREFILLED_SYRINGE | Freq: Once | INTRAMUSCULAR | Status: AC
  Administered 2021-12-30: 0.5 mL via INTRAMUSCULAR
  Filled 2021-12-30: qty 0.5

## 2021-12-30 MED ORDER — BACITRACIN ZINC 500 UNIT/GM EX OINT
TOPICAL_OINTMENT | Freq: Once | CUTANEOUS | Status: AC
  Filled 2021-12-30: qty 2.7

## 2021-12-30 MED ORDER — LIDOCAINE-EPINEPHRINE-TETRACAINE (LET) TOPICAL GEL
3.0000 mL | Freq: Once | TOPICAL | Status: AC
  Administered 2021-12-30: 3 mL via TOPICAL
  Filled 2021-12-30: qty 3

## 2021-12-30 MED ORDER — LIDOCAINE HCL (PF) 1 % IJ SOLN
5.0000 mL | Freq: Once | INTRAMUSCULAR | Status: AC
  Administered 2021-12-30: 5 mL via INTRADERMAL
  Filled 2021-12-30: qty 5

## 2021-12-30 MED ORDER — IOHEXOL 300 MG/ML  SOLN
100.0000 mL | Freq: Once | INTRAMUSCULAR | Status: AC | PRN
  Administered 2021-12-30: 100 mL via INTRAVENOUS

## 2021-12-30 NOTE — ED Notes (Signed)
Follow up pcp all info provided all questions answered  °
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
## 2021-12-30 NOTE — Discharge Instructions (Addendum)
You may alternate Tylenol 1000 mg every 6 hours as needed for pain, fever and Ibuprofen 800 mg every 6-8 hours as needed for pain, fever.  Please take Ibuprofen with food.  Do not take more than 4000 mg of Tylenol (acetaminophen) in a 24 hour period.   You may clean your wounds gently with warm soap and water.  The sutures in your chin are absorbable and do not need to be removed.  You may apply over-the-counter Neosporin to this laceration into your abrasions.   Your CT scans showed no sign of any fractures or internal injuries.
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
